# Patient Record
Sex: Female | Born: 2000 | Race: White | Hispanic: No | Marital: Married | Smoking: Never smoker
Health system: Southern US, Community
[De-identification: ages and names within clinical notes are randomized; demographics above are authoritative.]

## PROBLEM LIST (undated history)

## (undated) DIAGNOSIS — F988 Other specified behavioral and emotional disorders with onset usually occurring in childhood and adolescence: Secondary | ICD-10-CM

## (undated) DIAGNOSIS — J302 Other seasonal allergic rhinitis: Secondary | ICD-10-CM

## (undated) HISTORY — PX: COSMETIC SURGERY: SHX468

## (undated) HISTORY — PX: TONSILLECTOMY: SUR1361

---
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
## 2021-05-30 ENCOUNTER — Emergency Department
Admission: EM | Admit: 2021-05-30 | Discharge: 2021-05-30 | Disposition: A | Discharge status: Home / Self Care | Payer: 59 | Attending: Emergency Medicine | Admitting: Emergency Medicine

## 2021-05-30 ENCOUNTER — Emergency Department: Payer: 59

## 2021-05-30 ENCOUNTER — Other Ambulatory Visit: Payer: Self-pay

## 2021-05-30 DIAGNOSIS — Z87891 Personal history of nicotine dependence: Secondary | ICD-10-CM | POA: Diagnosis not present

## 2021-05-30 DIAGNOSIS — R079 Chest pain, unspecified: Secondary | ICD-10-CM | POA: Diagnosis not present

## 2021-05-30 DIAGNOSIS — F988 Other specified behavioral and emotional disorders with onset usually occurring in childhood and adolescence: Secondary | ICD-10-CM

## 2021-05-30 DIAGNOSIS — J45909 Unspecified asthma, uncomplicated: Secondary | ICD-10-CM | POA: Insufficient documentation

## 2021-05-30 DIAGNOSIS — R002 Palpitations: Secondary | ICD-10-CM | POA: Insufficient documentation

## 2021-05-30 DIAGNOSIS — R000 Tachycardia, unspecified: Secondary | ICD-10-CM | POA: Diagnosis present

## 2021-05-30 DIAGNOSIS — R42 Dizziness and giddiness: Secondary | ICD-10-CM | POA: Diagnosis not present

## 2021-05-30 DIAGNOSIS — Z7951 Long term (current) use of inhaled steroids: Secondary | ICD-10-CM | POA: Diagnosis not present

## 2021-05-30 HISTORY — DX: Other specified behavioral and emotional disorders with onset usually occurring in childhood and adolescence: F98.8

## 2021-05-30 LAB — CBC
HCT: 39.6 % (ref 36.0–46.0)
Hemoglobin: 13 g/dL (ref 12.0–15.0)
MCH: 28.2 pg (ref 26.0–34.0)
MCHC: 32.8 g/dL (ref 30.0–36.0)
MCV: 85.9 fL (ref 80.0–100.0)
Platelets: 210 10*3/uL (ref 150–400)
RBC: 4.61 MIL/uL (ref 3.87–5.11)
RDW: 12.6 % (ref 11.5–15.5)
WBC: 6.1 10*3/uL (ref 4.0–10.5)
nRBC: 0 % (ref 0.0–0.2)

## 2021-05-30 LAB — BASIC METABOLIC PANEL
Anion gap: 4 — ABNORMAL LOW (ref 5–15)
BUN: 6 mg/dL (ref 6–20)
CO2: 26 mmol/L (ref 22–32)
Calcium: 8.8 mg/dL — ABNORMAL LOW (ref 8.9–10.3)
Chloride: 107 mmol/L (ref 98–111)
Creatinine, Ser: 0.61 mg/dL (ref 0.44–1.00)
GFR, Estimated: >60 mL/min (ref >60)
Glucose, Bld: 102 mg/dL — ABNORMAL HIGH (ref 70–99)
Potassium: 3.6 mmol/L (ref 3.5–5.1)
Sodium: 137 mmol/L (ref 135–145)

## 2021-05-30 LAB — TROPONIN I (HIGH SENSITIVITY): Troponin I (High Sensitivity): <2 ng/L (ref <18)

## 2021-05-30 LAB — POC URINE PREG, ED: Preg Test, Ur: NEGATIVE

## 2021-05-30 NOTE — ED Notes (Signed)
Ambulatory to bathroom. Gait steady. Complains of feeling shaky and palpitations. ST rate 118 on monitor after ambulation. Back to 98-104 with rest.

## 2021-05-30 NOTE — ED Provider Notes (Signed)
[facility] Emergency Department Provider Note   ____________________________________________   Event Date/Time   First MD Initiated Contact with Patient 05/30/21 1119     (approximate)  I have reviewed the triage vital signs and the nursing notes.   HISTORY  Chief Complaint Chest Pain  HPI Charlotte Clark is a 20 y.o. female who presents for tachycardia  LOCATION: Chest DURATION: Began yesterday TIMING: Intermittent SEVERITY: Moderate QUALITY: Tachycardia CONTEXT: Patient states that beginning yesterday she found that her heart rate went up to the 150s with associated lightheadedness and presyncopal symptoms.  Patient states that she was given an EKG that showed "T wave changes and ST depressions".  Patient also states there is associated central chest pain that does not radiate MODIFYING FACTORS: Denies any exacerbating or relieving factors ASSOCIATED SYMPTOMS: Palpitations, central chest pain  Per medical record review, patient has history of ADD          Past Medical History:  Diagnosis Date   ADD (attention deficit disorder)    Asthma    daily inhalers and prn nebs.   Seasonal allergies     Patient Active Problem List   Diagnosis Date Noted   ADD (attention deficit disorder) 05/30/2021   Bleeding from throat 06/10/2011    Past Surgical History:  Procedure Laterality Date   COSMETIC SURGERY  age 81-6   removal of birthmark from forehead   LINGUAL FRENECTOMY N/A 05/19/2014   Procedure:  FRENECTOMY and gingivectomy;  Surgeon: Illene Regulus, DDS;  Location: [place];  Service: Dentistry;  Laterality: N/A;   NASAL HEMORRHAGE CONTROL  05/29/2011   Procedure: EPISTAXIS CONTROL;  Surgeon: Fannie Knee, MD;  Location: [place];  Service: ENT;  Laterality: Right;  cauterization of the right nasal septum   TONSILLECTOMY     TONSILLECTOMY AND ADENOIDECTOMY  05/29/2011   Procedure: TONSILLECTOMY AND ADENOIDECTOMY;  Surgeon: Fannie Knee, MD;  Location: [place];  Service: ENT;  Laterality: N/A;   TONSILLECTOMY AND ADENOIDECTOMY  06/10/2011   Procedure: TONSILLECTOMY AND ADENOIDECTOMY;  Surgeon: Fannie Knee, MD;  Location: [place];  Service: ENT;  Laterality: N/A;  Control of Postoperative Tonsilletomy Bleeding Thoat Pack in @ 0025 out @ 0237    Prior to Admission medications   Medication Sig Start Date End Date Taking? Authorizing Provider  albuterol (PROVENTIL HFA;VENTOLIN HFA) 108 (90 BASE) MCG/ACT inhaler Inhale 2 puffs into the lungs daily. For shortness of breath    [provider]  albuterol (PROVENTIL) (5 MG/ML) 0.5% nebulizer solution Take 2.5 mg by nebulization every 6 (six) hours as needed for wheezing or shortness of breath.    [provider]  budesonide (PULMICORT) 0.25 MG/2ML nebulizer solution Take 0.25 mg by nebulization 4 (four) times daily as needed. For asthma     [provider]  EPINEPHrine (EPIPEN IJ) Inject as directed.    [provider]  ibuprofen (MOTRIN IB) 200 MG tablet Take 3 tablets (600 mg total) by mouth 3 (three) times daily. 05/19/14   Mikey Bussing, DDS  levocetirizine (XYZAL) 5 MG tablet Take 5 mg by mouth every evening.    [provider]  mometasone (NASONEX) 50 MCG/ACT nasal spray Place 2 sprays into the nose 2 (two) times daily.    [provider]  mometasone-formoterol (DULERA) 200-5 MCG/ACT AERO Inhale 2 puffs into the lungs 2 (two) times daily.    [provider]  montelukast (SINGULAIR) 10 MG tablet Take 10 mg by  mouth at bedtime.    [provider]    Allergies Orange, Shellfish allergy, Penicillins, and Penicillins  Family History  Problem Relation Age of Onset   Asthma Father    Seizures Brother    Anesthesia problems Mother        post-op N/V; post-op migraine    Social History Social History   Tobacco Use   Smoking status: Never    Passive exposure: Yes   Smokeless tobacco: Former   Tobacco  comments:    stepfather smokes outside  Media planner   Vaping Use: Former  Substance Use Topics   Alcohol use: No   Drug use: No    Review of Systems Constitutional: No fever/chills Eyes: No visual changes. ENT: No sore throat. Cardiovascular: Endorses chest pain and palpitations Respiratory: Denies shortness of breath. Gastrointestinal: No abdominal pain.  No nausea, no vomiting.  No diarrhea. Genitourinary: Negative for dysuria. Musculoskeletal: Negative for acute arthralgias Skin: Negative for rash. Neurological: Negative for headaches, weakness/numbness/paresthesias in any extremity Psychiatric: Negative for suicidal ideation/homicidal ideation   ____________________________________________   PHYSICAL EXAM:  VITAL SIGNS: ED Triage Vitals [05/30/21 0929]  Enc Vitals Group     BP 131/84     Pulse Rate (!) 108     Resp 18     Temp 97.7 F (36.5 C)     Temp Source Oral     SpO2 100 %     Weight 180 lb (81.6 kg)     Height 5\' 9"  (1.753 m)     Head Circumference      Peak Flow      Pain Score 5     Pain Loc      Pain Edu?      Excl. in [place]?    Constitutional: Alert and oriented. Well appearing and in no acute distress. Eyes: Conjunctivae are normal. PERRL. Head: Atraumatic. Nose: No congestion/rhinnorhea. Mouth/Throat: Mucous membranes are moist. Neck: No stridor Cardiovascular: Grossly normal heart sounds.  Good peripheral circulation. Respiratory: Normal respiratory effort.  No retractions. Gastrointestinal: Soft and nontender. No distention. Musculoskeletal: No obvious deformities Neurologic:  Normal speech and language. No gross focal neurologic deficits are appreciated. Skin:  Skin is warm and dry. No rash noted. Psychiatric: Mood and affect are normal. Speech and behavior are normal.  ____________________________________________   LABS (all labs ordered are listed, but only abnormal results are displayed)  Labs Reviewed  BASIC METABOLIC PANEL -  Abnormal; Notable for the following components:      Result Value   Glucose, Bld 102 (*)    Calcium 8.8 (*)    Anion gap 4 (*)    All other components within normal limits  CBC  POC URINE PREG, ED  TROPONIN I (HIGH SENSITIVITY)  TROPONIN I (HIGH SENSITIVITY)   ____________________________________________  EKG  ED ECG REPORT I, Naaman Plummer, the attending physician, personally viewed and interpreted this ECG.  Date: 05/30/2021 EKG Time: 0926 Rate: 110 Rhythm: Tachycardic sinus rhythm QRS Axis: normal Intervals: normal ST/T Wave abnormalities: normal Narrative Interpretation: Tachycardic sinus rhythm.  No evidence of acute ischemia  ____________________________________________  RADIOLOGY  ED MD interpretation: 2 view chest x-ray shows no evidence of acute abnormalities including no pneumonia, pneumothorax, or widened mediastinum  Official radiology report(s): DG Chest 2 View  Result Date: 05/30/2021 CLINICAL DATA:  Chest pain EXAM: CHEST - 2 VIEW COMPARISON:  Chest x-ray dated May 26, 2007 FINDINGS: The heart size and mediastinal contours are within normal limits. Both lungs  are clear. The visualized skeletal structures are unremarkable. IMPRESSION: No active cardiopulmonary disease. Electronically Signed   By: Yetta Glassman M.D.   On: 05/30/2021 10:03    ____________________________________________   PROCEDURES  Procedure(s) performed (including Critical Care):  .1-3 Lead EKG Interpretation Performed by: Naaman Plummer, MD Authorized by: Naaman Plummer, MD     Interpretation: normal     ECG rate:  95   ECG rate assessment: normal     Rhythm: sinus rhythm     Ectopy: none     Conduction: normal     ____________________________________________   INITIAL IMPRESSION / ASSESSMENT AND PLAN / ED COURSE  As part of my medical decision making, I reviewed the following data within the electronic medical record, if available:  Nursing notes reviewed and  incorporated, Labs reviewed, EKG interpreted, Old chart reviewed, Radiograph reviewed and Notes from prior ED visits reviewed and incorporated      20 year old female presents with palpitations. EKG: No STEMI and no evidence of Brugadas sign, delta wave, epsilon wave, significantly prolonged QTc, or malignant arrhythmia. Based on H&P and testing, this patient appears to be low risk for emergent causes of palpitations such as, but not limited to, a malignant cardiac arrhythmia, ACS, pulmonary embolism, thyrotoxicosis, PNA, PTX.  The patient has been given strict return precautions and understands the need for further outpatient testing and treatment.      ____________________________________________   FINAL CLINICAL IMPRESSION(S) / ED DIAGNOSES  Final diagnoses:  Sinus tachycardia by electrocardiogram  Palpitations  Episodic lightheadedness     ED Discharge Orders     None        Note:  This document was prepared using Dragon voice recognition software and may include unintentional dictation errors.    Naaman Plummer, MD 05/30/21 1316

## 2021-05-30 NOTE — ED Triage Notes (Signed)
Pt here with tachycardia that started on yesterday. 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 Pt began having the same symptoms today in class and was told to come to the ED for eval. Pt states CP is centered and does not radiate. Pt in in NAD in triage.

## 2021-05-30 NOTE — ED Notes (Signed)
Pt to ED for tachycardia and CP. 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 Pt states she has Left upper CP, and dizziness,denies SOB at this time. Denies any recent chest trauma and caffeine drinks.  Pt states she does take 30mg  XR of adderal daily  Has hx of asthma and recently treated for bronchitis.   Pt is A&Ox4, NAD

## 2023-02-23 IMAGING — CR DG CHEST 2V
1 series · 2 of 2 positions shown · non-contrast
Comparison: Chest x-ray dated May 26, 2007

CLINICAL DATA: Chest pain

EXAM:
CHEST - 2 VIEW

[Series 1: w chest pa · 0.14mm/px · 2 of 2 slices shown]
[im 1/2]
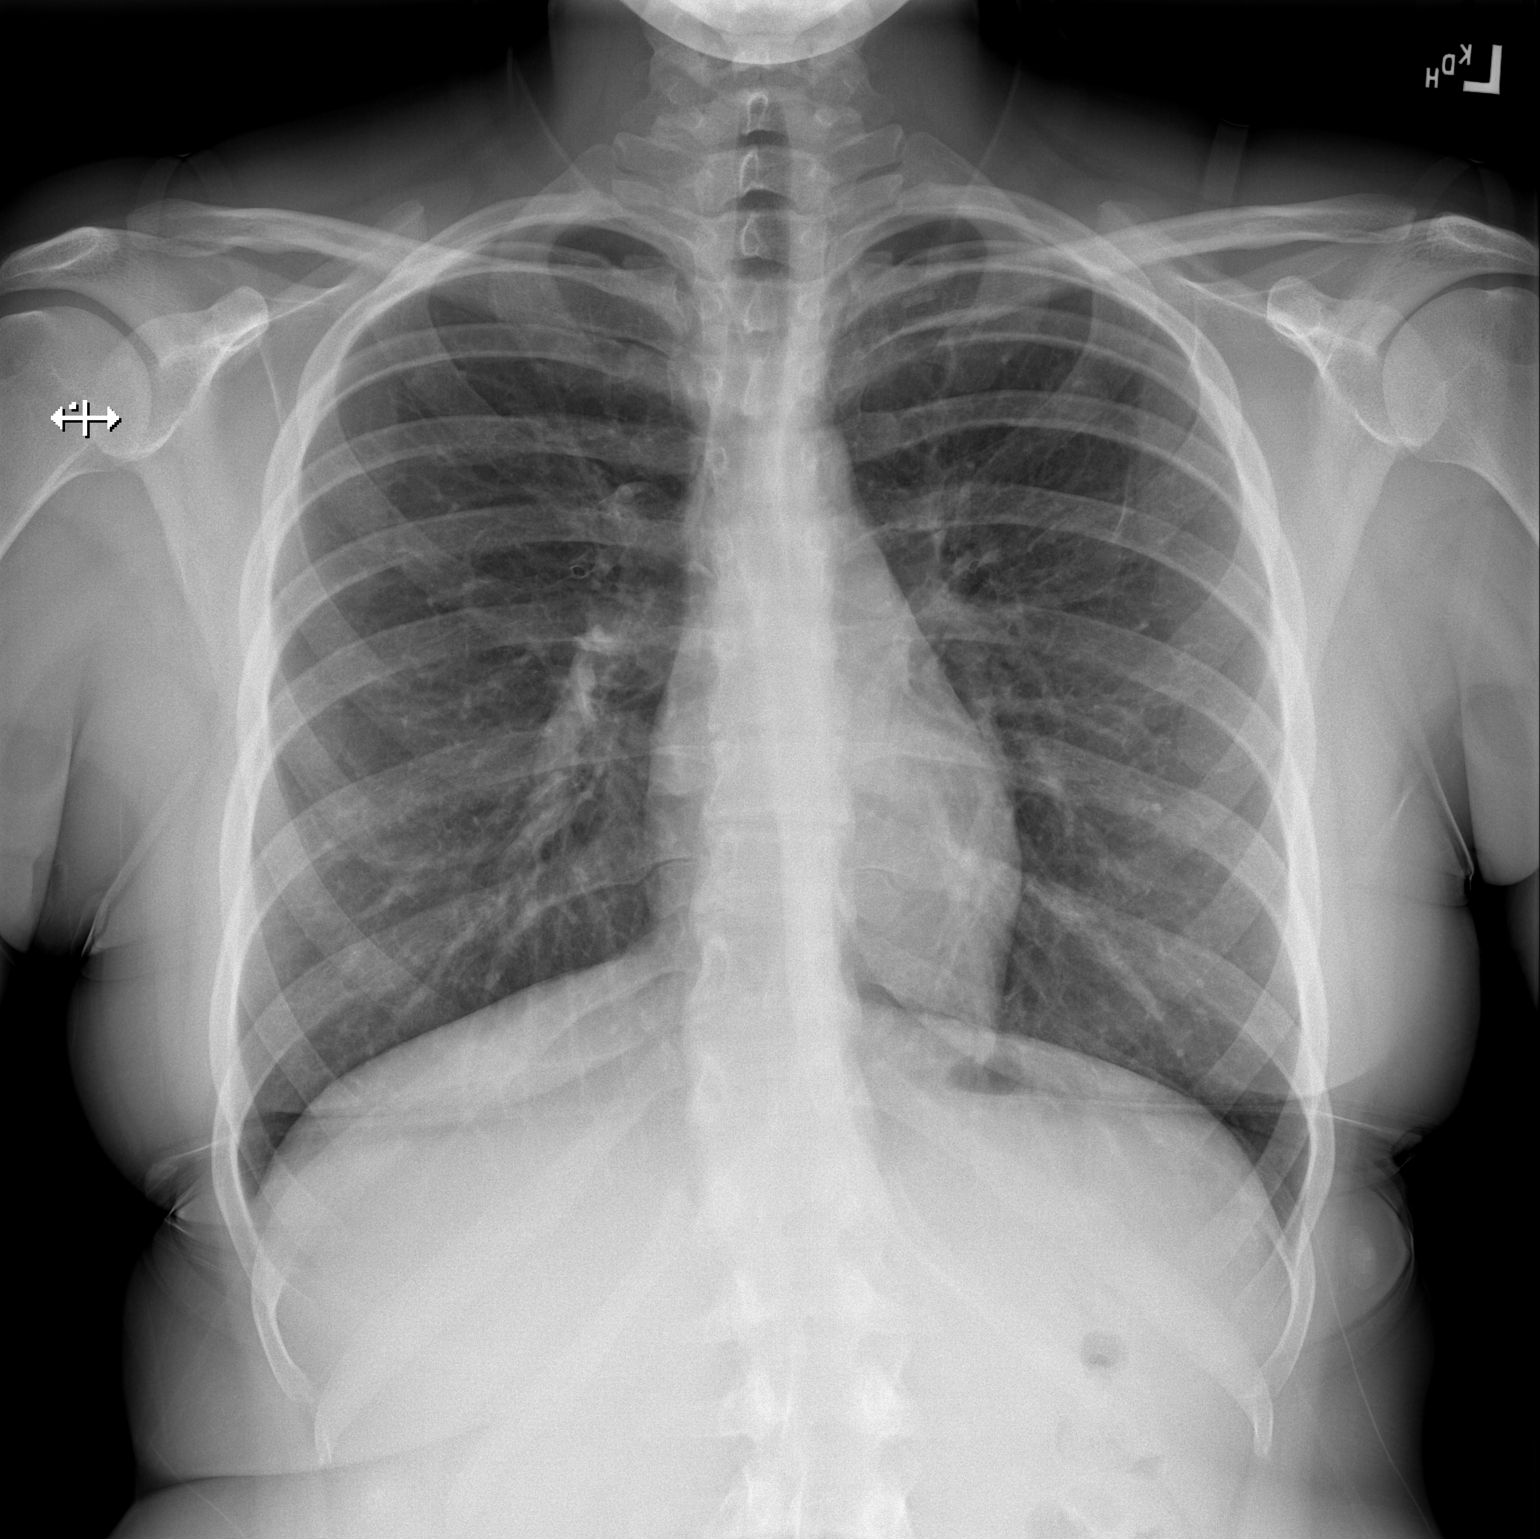
[im 2/2]
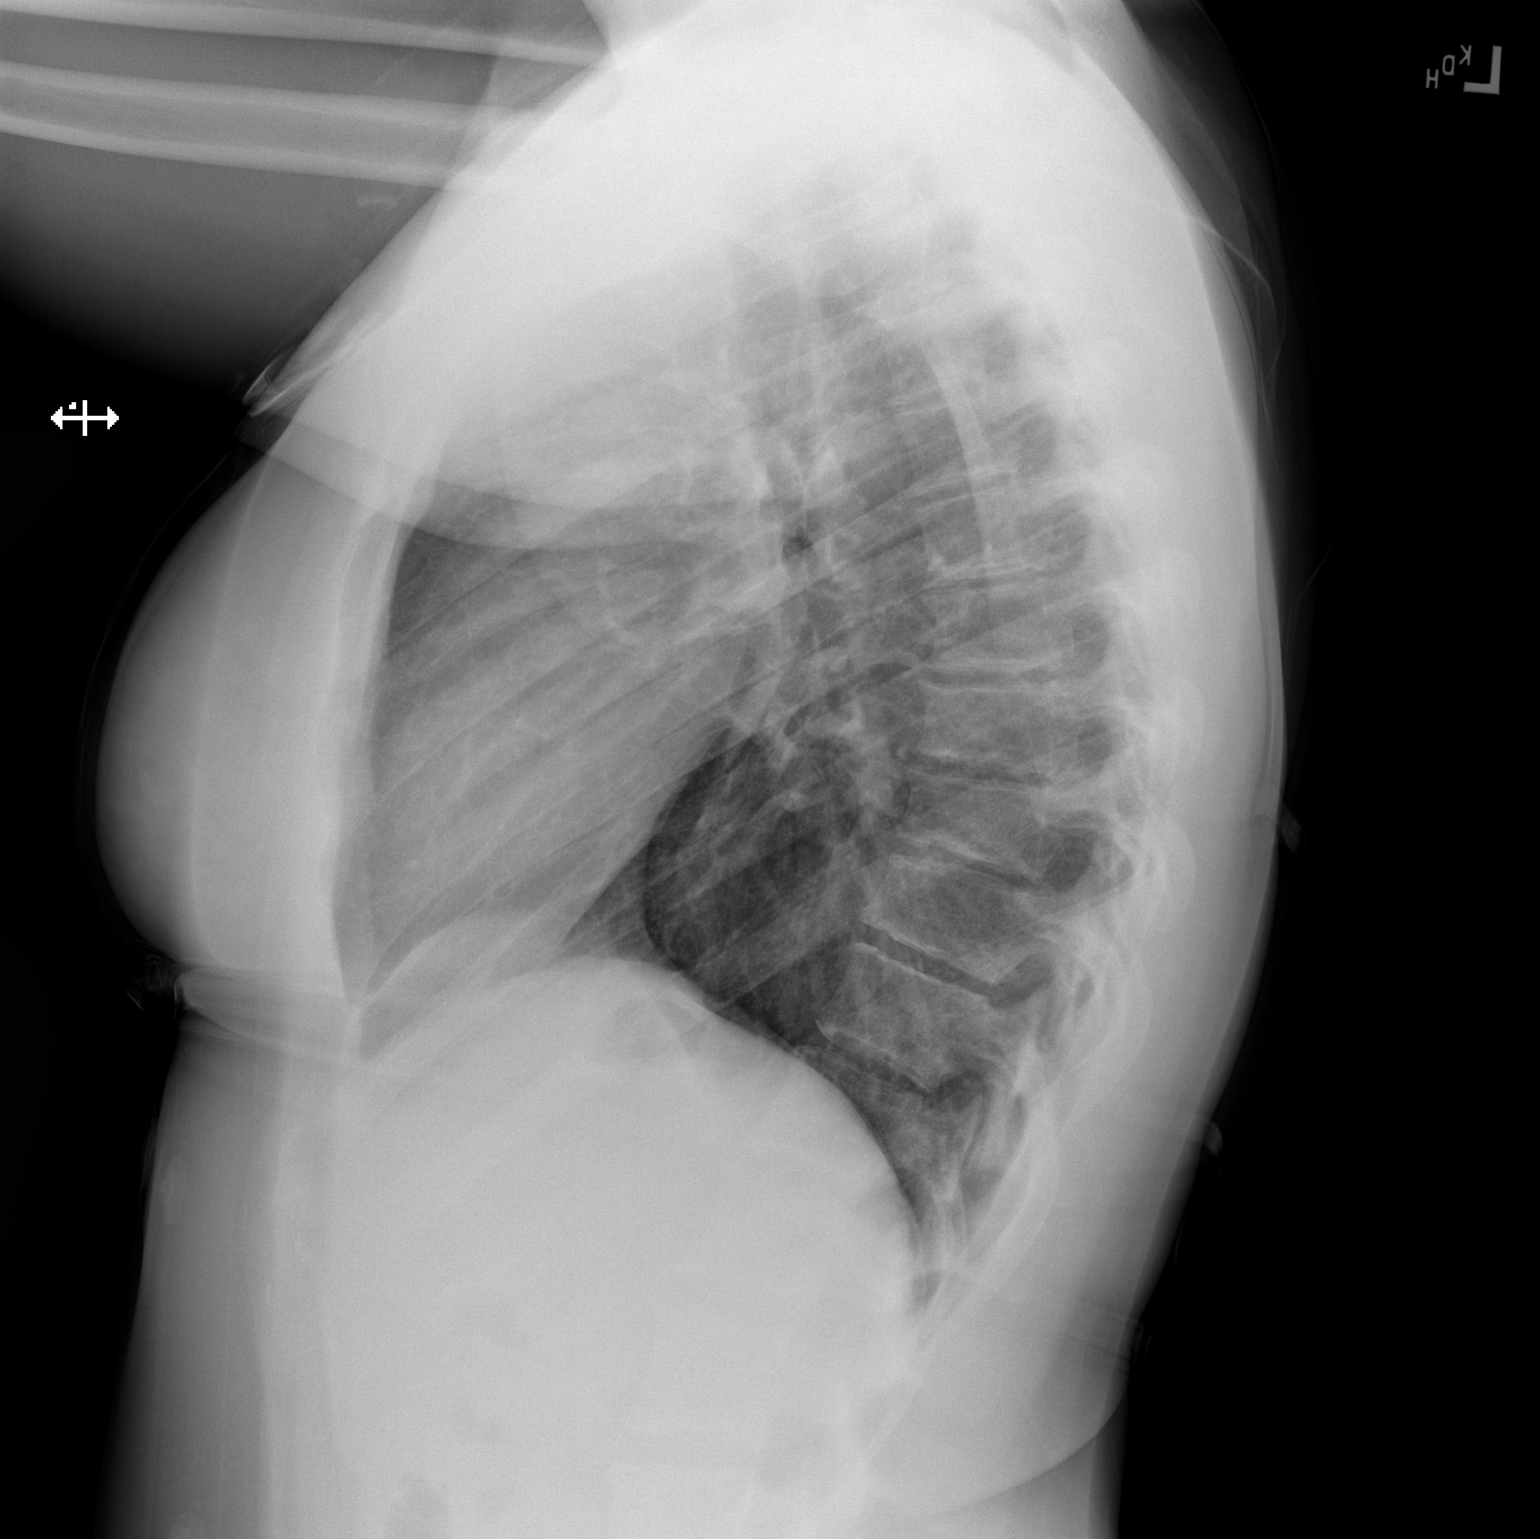

[2 of 2 positions shown; findings below may reference images not displayed]

FINDINGS: The heart size and mediastinal contours are within normal limits.
Both lungs are clear. The visualized skeletal structures are
unremarkable.
IMPRESSION: No active cardiopulmonary disease.

## 2024-02-01 ENCOUNTER — Emergency Department (HOSPITAL_BASED_OUTPATIENT_CLINIC_OR_DEPARTMENT_OTHER)

## 2024-02-01 ENCOUNTER — Encounter (HOSPITAL_BASED_OUTPATIENT_CLINIC_OR_DEPARTMENT_OTHER): Payer: Self-pay

## 2024-02-01 ENCOUNTER — Emergency Department (HOSPITAL_BASED_OUTPATIENT_CLINIC_OR_DEPARTMENT_OTHER)
Admission: EM | Admit: 2024-02-01 | Discharge: 2024-02-01 | Disposition: A | Discharge status: Home / Self Care | Attending: Emergency Medicine | Admitting: Emergency Medicine

## 2024-02-01 ENCOUNTER — Other Ambulatory Visit: Payer: Self-pay

## 2024-02-01 DIAGNOSIS — J4541 Moderate persistent asthma with (acute) exacerbation: Secondary | ICD-10-CM | POA: Insufficient documentation

## 2024-02-01 DIAGNOSIS — R059 Cough, unspecified: Secondary | ICD-10-CM | POA: Diagnosis present

## 2024-02-01 DIAGNOSIS — Z720 Tobacco use: Secondary | ICD-10-CM | POA: Insufficient documentation

## 2024-02-01 MED ORDER — ALBUTEROL SULFATE HFA 108 (90 BASE) MCG/ACT IN AERS
2.0000 | INHALATION_SPRAY | Freq: Once | RESPIRATORY_TRACT | Status: AC
  Administered 2024-02-01: 2 via RESPIRATORY_TRACT
  Filled 2024-02-01: qty 6.7

## 2024-02-01 MED ORDER — PREDNISONE 50 MG PO TABS
60.0000 mg | ORAL_TABLET | Freq: Once | ORAL | Status: AC
  Administered 2024-02-01: 60 mg via ORAL
  Filled 2024-02-01: qty 1

## 2024-02-01 MED ORDER — PREDNISONE 50 MG PO TABS: ORAL_TABLET | ORAL | 0 refills | Status: AC

## 2024-02-01 NOTE — ED Triage Notes (Signed)
 Arrived via EMS. Woke from sleep with SOB around 0115. Hx of Asthma. Has had Duo nebs x2 and Albuterol  with EMS.

## 2024-02-01 NOTE — ED Provider Notes (Signed)
 [place] EMERGENCY DEPARTMENT AT [facility] Provider Note   CSN: [id number] Arrival date & time: 02/01/24  9796     Patient presents with: No chief complaint on file.   Charlotte Clark is a 23 y.o. female.   The history is provided by the patient, the spouse and a parent.  Patient with history of asthma presents for an asthma attack.  Patient reports she was in her usual state of health when she went to sleep, but she woke up with cough wheezing and shortness of breath.  She reports due to severe wheezing she had episodes of vomiting.  No chest pain.  She called EMS, received nebulizer treatments and is now improved.  No rash, no oral swelling    Past Medical History:  Diagnosis Date   ADD (attention deficit disorder)    Asthma    daily inhalers and prn nebs.   Seasonal allergies     Prior to Admission medications   Medication Sig Start Date End Date Taking? Authorizing Provider  predniSONE  (DELTASONE ) 50 MG tablet 1 tablet PO QD X4 days 02/01/24  Yes Midge Golas, MD  albuterol  (PROVENTIL  HFA;VENTOLIN  HFA) 108 (90 BASE) MCG/ACT inhaler Inhale 2 puffs into the lungs daily. For shortness of breath    [provider]  albuterol  (PROVENTIL ) (5 MG/ML) 0.5% nebulizer solution Take 2.5 mg by nebulization every 6 (six) hours as needed for wheezing or shortness of breath.    [provider]  budesonide  (PULMICORT ) 0.25 MG/2ML nebulizer solution Take 0.25 mg by nebulization 4 (four) times daily as needed. For asthma     [provider]  EPINEPHrine  (EPIPEN  IJ) Inject as directed.    [provider]  levocetirizine (XYZAL) 5 MG tablet Take 5 mg by mouth every evening.    [provider]  mometasone (NASONEX) 50 MCG/ACT nasal spray Place 2 sprays into the nose 2 (two) times daily.    [provider]  mometasone-formoterol (DULERA) 200-5 MCG/ACT AERO Inhale 2 puffs into the lungs 2 (two) times daily.    [provider]   montelukast (SINGULAIR) 10 MG tablet Take 10 mg by mouth at bedtime.    [provider]    Allergies: Orange, Shellfish allergy, Penicillins, and Penicillins    Review of Systems  Constitutional:  Negative for fever.  Respiratory:  Positive for cough, shortness of breath and wheezing.   Cardiovascular:  Negative for chest pain.  Skin:  Negative for rash.    Updated Vital Signs BP 138/79   Pulse (!) 110   Temp 97.9 F (36.6 C) (Oral)   Resp 15   Ht 1.753 m (5' 9)   Wt 99.8 kg   LMP 01/30/2024 (Exact Date)   SpO2 96%   BMI 32.49 kg/m   Physical Exam CONSTITUTIONAL: Well developed/well nourished, no distress HEAD: Normocephalic/atraumatic EYES: EOMI/PERRL ENMT: Mucous membranes moist, uvula midline, no angioedema, no stridor, no erythema, no exudate NECK: supple no meningeal signs SPINE/BACK:entire spine nontender CV: S1/S2 noted, mild tachycardic LUNGS: Lungs are clear to auscultation bilaterally, no apparent distress ABDOMEN: soft, nontender NEURO: Pt is awake/alert/appropriate, moves all extremitiesx4.  No facial droop.   EXTREMITIES: pulses normal/equal, full ROM SKIN: warm, color normal, no rash PSYCH: no abnormalities of mood noted, alert and oriented to situation  (all labs ordered are listed, but only abnormal results are displayed) Labs Reviewed - No data to display  EKG: None  Radiology: [facility] Chest Port 1 View Result Date: 02/01/2024 CLINICAL DATA:  Shortness  of breath. EXAM: PORTABLE CHEST 1 VIEW COMPARISON:  July 10, 2021 FINDINGS: The heart size and mediastinal contours are within normal limits. Both lungs are clear. The visualized skeletal structures are unremarkable. IMPRESSION: No active disease. Electronically Signed   By: Suzen Dials M.D.   On: 02/01/2024 02:34     Procedures   Medications Ordered in the ED  predniSONE  (DELTASONE ) tablet 60 mg (60 mg Oral Given 02/01/24 0234)  albuterol  (VENTOLIN  HFA) 108 (90 Base) MCG/ACT  inhaler 2 puff (2 puffs Inhalation Given 02/01/24 0231)    Clinical Course as of 02/01/24 0313  Mon Feb 01, 2024  0235 Patient presents with shortness of breath consistent with asthma exacerbation, has already received albuterol  and improved.  Will give steroids and reassess [DW]  0313 Patient feeling improved.  No tachypnea, no wheezing, no hypoxia.  Patient feels comfortable for discharge home.  She has been provided with an albuterol  MDI and will be given a 4-day course of prednisone .  Low suspicion for ACS/PE/CHF at this time No signs of pneumonia on x-ray [DW]    Clinical Course User Index [DW] Midge Golas, MD                                 Medical Decision Making Amount and/or Complexity of Data Reviewed Radiology: ordered.  Risk Prescription drug management.   This patient presents to the ED for concern of shortness of breath, this involves an extensive number of treatment options, and is a complaint that carries with it a high risk of complications and morbidity.  The differential diagnosis includes but is not limited to Acute coronary syndrome, pneumonia, acute pulmonary edema, pneumothorax, acute anemia, pulmonary embolism Asthma exacerbation  Comorbidities that complicate the patient evaluation: Patient's presentation is complicated by their history of asthma and anaphylaxis  Social Determinants of Health: Patient's history of tobacco use  increases the complexity of managing their presentation  Additional history obtained: Additional history obtained from family and spouse Records reviewed Primary Care Documents  Imaging Studies ordered: I ordered imaging studies including X-ray chest  I independently visualized and interpreted imaging which showed no acute findings I agree with the radiologist interpretation  Cardiac Monitoring: The patient was maintained on a cardiac monitor.  I personally viewed and interpreted the cardiac monitor which showed an  underlying rhythm of:  sinus tachycardia  Medicines ordered and prescription drug management: I ordered medication including prednisone  for short of breath Reevaluation of the patient after these medicines showed that the patient    improved  Test Considered: I considered labs, but since patient is improving will defer at this time  Reevaluation: After the interventions noted above, I reevaluated the patient and found that they have :improved  Complexity of problems addressed: Patient's presentation is most consistent with  acute presentation with potential threat to life or bodily function  Disposition: After consideration of the diagnostic results and the patient's response to treatment,  I feel that the patent would benefit from discharge  .        Final diagnoses:  Moderate persistent asthma with exacerbation    ED Discharge Orders          Ordered    predniSONE  (DELTASONE ) 50 MG tablet        02/01/24 0313               Midge Golas, MD 02/01/24 [phone]
